# Patient Record
Sex: Male | Born: 1990 | Race: White | Hispanic: No | Marital: Single | Smoking: Current every day smoker
Health system: Southern US, Community
[De-identification: ages and names within clinical notes are randomized; demographics above are authoritative.]

## PROBLEM LIST (undated history)

## (undated) DIAGNOSIS — R6250 Unspecified lack of expected normal physiological development in childhood: Secondary | ICD-10-CM

## (undated) DIAGNOSIS — B019 Varicella without complication: Secondary | ICD-10-CM

## (undated) DIAGNOSIS — S060XAA Concussion with loss of consciousness status unknown, initial encounter: Secondary | ICD-10-CM

## (undated) DIAGNOSIS — J45909 Unspecified asthma, uncomplicated: Secondary | ICD-10-CM

## (undated) DIAGNOSIS — S060X9A Concussion with loss of consciousness of unspecified duration, initial encounter: Secondary | ICD-10-CM

## (undated) HISTORY — PX: TONSILLECTOMY AND ADENOIDECTOMY: SUR1326

## (undated) HISTORY — DX: Unspecified lack of expected normal physiological development in childhood: R62.50

## (undated) HISTORY — DX: Concussion with loss of consciousness status unknown, initial encounter: S06.0XAA

## (undated) HISTORY — PX: MYRINGOTOMY: SUR874

## (undated) HISTORY — DX: Concussion with loss of consciousness of unspecified duration, initial encounter: S06.0X9A

## (undated) HISTORY — DX: Varicella without complication: B01.9

## (undated) HISTORY — DX: Unspecified asthma, uncomplicated: J45.909

---
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
## 2011-01-12 NOTE — Assessment & Plan Note (Signed)
[facility]                           PRIMARY CARE OFFICE NOTE   NAME:Esquilin, Nakia                        MRN:          [id number]  DATE:02/06/2007                            DOB:          02/01/1991    Patient is a 20 year old male who presents for a wellness examination.  He is going to start playing football.   ALLERGIES:  None.   PAST MEDICAL HISTORY:  MRSA in the right ear two weeks ago, resolved  with antibiotic.  Chronically dry skin.  ADHD.   FAMILY HISTORY:  Brother with ADD.  Dry skin runs in the family.   SOCIAL HISTORY:  He is finishing 9th grade.   CURRENT MEDICATIONS:  Ritalin XR 1-2 tablets in the morning, 10 mg.   REVIEW OF SYSTEMS:  No chest pain, shortness of breath.  No  musculoskeletal complaints.  He uses glasses or contacts.  The rest of  the 18-point review of systems is negative.   PHYSICAL EXAMINATION:  VITAL SIGNS:  Blood pressure 113/65, pulse 56,  temp 98.1.  Weight 160 pounds.  GENERAL:  Looks well.  HEENT:  Moist mucosa.  NECK:  Supple.  No thyromegaly or bruit.  LUNGS:  Clear.  No wheezes or rales.  CARDIAC:  S1 and S2.  No murmur, no gallop.  ABDOMEN:  Soft, nontender.  No organomegaly.  No masses felt.  EXTREMITIES:  Lower extremities without edema.  SKIN:  Dry on the abdomen, flanks, ears, and legs.  GENITOURINARY:  Testicles, refused the examination.  Self-exam is  normal.  NEUROLOGIC:  He looked depressed.   LABS:  None.   ASSESSMENT/PLAN:  1. Normal wellness examination.  Age/health-related issues discussed.      Healthy lifestyle discussed with him and his mother.  Testicular      self-exam is reinforced.  He is okay to play football.  Advised to      avoid over-heating, drink Gatorade or Power Aid during practice or      play.  2. Attention-deficit disorder:  Ritalin LA 10 mg 1-2 in the morning.      He is to continue through the summer.  3. Methicillin-resistant staphylococcus aureus  infection, resolved.      Given information about      methicillin-resistant staphylococcus aureus to the patient and      mother.  4. Dry skin ,hereditory:  Triamcinolone and Eucerin 1:10 to use daily      p.r.n.     Georgina Quint. Plotnikov, MD  Electronically Signed    AVP/MedQ  DD: 02/06/2007  DT: 02/06/2007  Job #: [id number]
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
## 2011-10-15 ENCOUNTER — Ambulatory Visit: Payer: BC Managed Care – PPO | Admitting: Internal Medicine

## 2011-10-15 VITALS — BP 119/70 | HR 49 | Temp 98.3°F | Resp 18 | Ht 72.0 in | Wt 204.0 lb

## 2011-10-15 DIAGNOSIS — J040 Acute laryngitis: Secondary | ICD-10-CM

## 2011-10-15 DIAGNOSIS — J40 Bronchitis, not specified as acute or chronic: Secondary | ICD-10-CM

## 2011-10-15 MED ORDER — AZITHROMYCIN 250 MG PO TABS: ORAL_TABLET | ORAL | Status: DC

## 2011-10-15 MED ORDER — HYDROCODONE-ACETAMINOPHEN 7.5-500 MG/15ML PO SOLN: 5.0000 mL | Freq: Four times a day (QID) | ORAL | Status: DC | PRN

## 2011-10-15 NOTE — Progress Notes (Signed)
  Subjective:    Patient ID: Alfred Romero, male    DOB: 05-26-1991, 21 y.o.   MRN: [id number]  HPI Cough, fatigue, hoarseness. No SOB,CP   Review of Systems     Objective:   Physical Exam  Constitutional: He is oriented to person, place, and time. He appears well-developed and well-nourished. No distress.  HENT:  Right Ear: Tympanic membrane normal.  Left Ear: Tympanic membrane normal.  Nose: Mucosal edema, rhinorrhea and sinus tenderness present. Right sinus exhibits no maxillary sinus tenderness and no frontal sinus tenderness. Left sinus exhibits no maxillary sinus tenderness and no frontal sinus tenderness.  Mouth/Throat: Uvula is midline. No oropharyngeal exudate.  Eyes: EOM are normal.  Neck: Normal range of motion.  Cardiovascular: Normal rate and regular rhythm.   Pulmonary/Chest: Effort normal. No respiratory distress. He has no wheezes.  Neurological: He is alert and oriented to person, place, and time.          Assessment & Plan:  ZPAK Lortab 1tsp prn Resp. care

## 2011-10-20 ENCOUNTER — Encounter: Payer: Self-pay | Admitting: Family Medicine

## 2011-10-20 ENCOUNTER — Encounter: Payer: Self-pay | Admitting: *Deleted

## 2011-10-23 ENCOUNTER — Telehealth: Payer: Self-pay

## 2011-10-23 NOTE — Telephone Encounter (Signed)
.  [facility] Pt's mother called regarding receipt of a letter that stated [facility] had test results for her son Alfred Romero, but he has not been seen to have any testing done. Pt and Pt's mother would like clarification regarding this letter. Please contact them at [phone].

## 2011-10-24 NOTE — Telephone Encounter (Signed)
Spoke with pt's mother. The labs in ? were abstracted from pt's paper chart and I think that the results of the abstracted labs got sent to Dr. Elbert Ewings in which turn got sent to [place] to send a normal lab letter.

## 2012-12-17 ENCOUNTER — Encounter (HOSPITAL_COMMUNITY): Payer: Self-pay

## 2012-12-17 ENCOUNTER — Emergency Department (HOSPITAL_COMMUNITY)
Admission: EM | Admit: 2012-12-17 | Discharge: 2012-12-17 | Disposition: A | Discharge status: Home / Self Care | Payer: BC Managed Care – PPO | Source: Home / Self Care | Attending: Family Medicine | Admitting: Family Medicine

## 2012-12-17 DIAGNOSIS — J329 Chronic sinusitis, unspecified: Secondary | ICD-10-CM

## 2012-12-17 DIAGNOSIS — J40 Bronchitis, not specified as acute or chronic: Secondary | ICD-10-CM

## 2012-12-17 LAB — POCT RAPID STREP A: Streptococcus, Group A Screen (Direct): NEGATIVE

## 2012-12-17 MED ORDER — AZITHROMYCIN 250 MG PO TABS: ORAL_TABLET | ORAL | Status: DC

## 2012-12-17 MED ORDER — ALBUTEROL SULFATE HFA 108 (90 BASE) MCG/ACT IN AERS: 1.0000 | INHALATION_SPRAY | Freq: Four times a day (QID) | RESPIRATORY_TRACT | Status: DC | PRN

## 2012-12-17 MED ORDER — IBUPROFEN 600 MG PO TABS: 600.0000 mg | ORAL_TABLET | Freq: Three times a day (TID) | ORAL | Status: DC | PRN

## 2012-12-17 MED ORDER — PREDNISONE 20 MG PO TABS: ORAL_TABLET | ORAL | Status: DC

## 2012-12-17 MED ORDER — CETIRIZINE-PSEUDOEPHEDRINE ER 5-120 MG PO TB12: 1.0000 | ORAL_TABLET | Freq: Two times a day (BID) | ORAL | Status: DC | PRN

## 2012-12-17 NOTE — ED Notes (Signed)
Discussed percussive therapy for chest congestion

## 2012-12-17 NOTE — ED Provider Notes (Signed)
History     CSN: [id number]  Arrival date & time 12/17/12  1546   First MD Initiated Contact with Patient 12/17/12 1547      Chief Complaint  Patient presents with  . Cough    (Consider location/radiation/quality/duration/timing/severity/associated sxs/prior treatment) HPI Comments: 22 year old smoker male with history of asthma. Here complaining of sore throat, nasal congestion, nasal pressures sneezing headache fatigue and persistent coughing for the last 4 days. Denies fever but has had chills. Decreased appetite and decreased energy level. Denies chest pain, abdominal pain vomiting or diarrhea although reports intermittent nausea. 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   Past Medical History  Diagnosis Date  . 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 Hermelinda Medicus (endocrinologist)    Past Surgical History  Procedure Laterality Date  . Tonsillectomy and adenoidectomy    . Myringotomy      History reviewed. No pertinent family history.  History  Substance Use Topics  . Smoking status: Current Every Day Smoker  . Smokeless tobacco: Not on file  . Alcohol Use: Not on file      Review of Systems  Constitutional: Positive for chills, appetite change and fatigue. Negative for fever and diaphoresis.  HENT: Positive for congestion, sore throat, rhinorrhea and sneezing. Negative for trouble swallowing.   Eyes: Negative for discharge.  Respiratory: Positive for cough. Negative for shortness of breath.   Cardiovascular: Negative for chest pain.  Gastrointestinal: Negative for nausea, vomiting, abdominal pain and diarrhea.  Skin: Negative for rash.  Neurological: Positive for headaches. Negative for dizziness.  All other systems reviewed and are negative.    Allergies  Review of patient's allergies indicates no known allergies.  Home Medications   Current Outpatient Rx  Name  Route  Sig  Dispense  Refill  . albuterol (PROVENTIL HFA;VENTOLIN  HFA) 108 (90 BASE) MCG/ACT inhaler   Inhalation   Inhale 1-2 puffs into the lungs every 6 (six) hours as needed for wheezing.   1 Inhaler   0   . azithromycin (ZITHROMAX) 250 MG tablet      2 tabs by mouth daily 1 then 1 tablet daily for 4 more days.   6 tablet   0   . cetirizine-pseudoephedrine (ZYRTEC-D) 5-120 MG per tablet   Oral   Take 1 tablet by mouth 2 (two) times daily as needed for allergies or rhinitis.   30 tablet   0   . ibuprofen (ADVIL,MOTRIN) 600 MG tablet   Oral   Take 1 tablet (600 mg total) by mouth every 8 (eight) hours as needed for pain or fever.   30 tablet   0   . predniSONE (DELTASONE) 20 MG tablet      2 tabs by mouth daily for 5 days   10 tablet   0     BP 132/77  Pulse 68  Temp(Src) 98.7 F (37.1 C) (Oral)  Resp 18  SpO2 98%  Physical Exam  Nursing note and vitals reviewed. Constitutional: He is oriented to person, place, and time. He appears well-developed and well-nourished. No distress.  HENT:  Head: Normocephalic and atraumatic.  Nasal Congestion with erythema and swelling of nasal turbinates, rhinorrhea. pharyngeal erythema no exudates. No uvula deviation. No trismus. TM's with clear fluid and increased vascular markings no dullness bilaterally no swelling or bulging  Cardiovascular: Normal rate, regular rhythm and normal heart sounds.   Pulmonary/Chest: Effort normal. No respiratory distress. He has no wheezes. He has no rales.  He exhibits no tenderness.  Mildly prolonged expiration and bilateral scattered expiratory rhonchi. No active wheezing. No orthopnea or tachypnea. No rales  Neurological: He is alert and oriented to person, place, and time.  Skin: No rash noted. He is not diaphoretic.    ED Course  Procedures (including critical care time)  Labs Reviewed  POCT RAPID STREP A ([facility] CARE ONLY)   No results found.   1. Bronchitis   2. Rhinosinusitis       MDM  Encouraged to quit smoking. Prescribed albuterol,  prednisone, cetirizine/pseudoephedrine, ibuprofen and provided a hold prescription for azithromycin. Supportive care and red flags should prompt his return to medical attention discussed with patient and provided in writing.      Sharin Grave, MD 12/17/12 1706

## 2012-12-17 NOTE — ED Notes (Signed)
C/o cough, congestion, ST; NAD, handling secretions well; minimal relief w OTC medications

## 2015-09-23 ENCOUNTER — Encounter (HOSPITAL_COMMUNITY): Payer: Self-pay | Admitting: Emergency Medicine

## 2015-09-23 ENCOUNTER — Emergency Department (HOSPITAL_COMMUNITY)
Admission: EM | Admit: 2015-09-23 | Discharge: 2015-09-23 | Disposition: A | Discharge status: Home / Self Care | Payer: Self-pay | Attending: Emergency Medicine | Admitting: Emergency Medicine

## 2015-09-23 DIAGNOSIS — Y990 Civilian activity done for income or pay: Secondary | ICD-10-CM | POA: Insufficient documentation

## 2015-09-23 DIAGNOSIS — Z23 Encounter for immunization: Secondary | ICD-10-CM | POA: Insufficient documentation

## 2015-09-23 DIAGNOSIS — Y92812 Truck as the place of occurrence of the external cause: Secondary | ICD-10-CM | POA: Insufficient documentation

## 2015-09-23 DIAGNOSIS — Z79899 Other long term (current) drug therapy: Secondary | ICD-10-CM | POA: Insufficient documentation

## 2015-09-23 DIAGNOSIS — F17200 Nicotine dependence, unspecified, uncomplicated: Secondary | ICD-10-CM | POA: Insufficient documentation

## 2015-09-23 DIAGNOSIS — Y9389 Activity, other specified: Secondary | ICD-10-CM | POA: Insufficient documentation

## 2015-09-23 DIAGNOSIS — W208XXA Other cause of strike by thrown, projected or falling object, initial encounter: Secondary | ICD-10-CM | POA: Insufficient documentation

## 2015-09-23 DIAGNOSIS — S0501XA Injury of conjunctiva and corneal abrasion without foreign body, right eye, initial encounter: Secondary | ICD-10-CM

## 2015-09-23 MED ORDER — TETANUS-DIPHTH-ACELL PERTUSSIS 5-2.5-18.5 LF-MCG/0.5 IM SUSP
0.5000 mL | Freq: Once | INTRAMUSCULAR | Status: AC
  Administered 2015-09-23: 0.5 mL via INTRAMUSCULAR
  Filled 2015-09-23: qty 0.5

## 2015-09-23 MED ORDER — TETRACAINE HCL 0.5 % OP SOLN
2.0000 [drp] | Freq: Once | OPHTHALMIC | Status: AC
  Administered 2015-09-23: 2 [drp] via OPHTHALMIC
  Filled 2015-09-23: qty 2

## 2015-09-23 MED ORDER — LEVOFLOXACIN 0.5 % OP SOLN: OPHTHALMIC | Status: DC

## 2015-09-23 MED ORDER — FLUORESCEIN SODIUM 1 MG OP STRP
1.0000 | ORAL_STRIP | Freq: Once | OPHTHALMIC | Status: AC
  Administered 2015-09-23: 1 via OPHTHALMIC
  Filled 2015-09-23: qty 1

## 2015-09-23 NOTE — ED Notes (Signed)
PA at bedside flushing eye.

## 2015-09-23 NOTE — ED Provider Notes (Signed)
CSN: [id number]     Arrival date & time 09/23/15  1930 History  By signing my name below, I, Karl Ito, attest that this documentation has been prepared under the direction and in the presence of non-physician practitioner, Elizabeth Sauer, PA-C. Electronically Signed: Linna Darner, Scribe. 09/23/2015. 8:03 PM.    Chief Complaint  Patient presents with  . Eye Injury     The history is provided by the patient. No language interpreter was used.     HPI Comments: Alfred Romero is a 25 y.o. male who presents to the Emergency Department complaining of sudden onset, intermittent, worsening, 4/10, scratching right eye pain beginning at approximately 9 o'clock this morning . He reports that he was working on his truck when a piece of rust hit him in the eye.He reports that he flushed his eye out with contact drops. He endorses that he is able to see out of his right eye when the pain is not present. He reports that he wears contacts and was wearing them when the accident occurred. He states that he took them out s/p being struck by the rust. Pt reports that he is tender to palpation towards the center of his eye. He denies burning, eye numbness, loss of vision, blurry vision, or any other associated symptoms at this time. Tetanus status unknown.   Past Medical History  Diagnosis Date  . 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 Hermelinda Medicus (endocrinologist)   Past Surgical History  Procedure Laterality Date  . Tonsillectomy and adenoidectomy    . Myringotomy     No family history on file. Social History  Substance Use Topics  . Smoking status: Current Every Day Smoker  . Smokeless tobacco: None  . Alcohol Use: None    Review of Systems  Constitutional: Negative for fever.  Eyes: Positive for pain. Negative for visual disturbance.       Blurry vision in right eye when pain presents  Neurological: Negative for headaches.      Allergies  Review of patient's  allergies indicates no known allergies.  Home Medications   Prior to Admission medications   Medication Sig Start Date End Date Taking? Authorizing Provider  albuterol (PROVENTIL HFA;VENTOLIN HFA) 108 (90 BASE) MCG/ACT inhaler Inhale 1-2 puffs into the lungs every 6 (six) hours as needed for wheezing. 12/17/12   Adlih Moreno-Coll, MD  azithromycin (ZITHROMAX) 250 MG tablet 2 tabs by mouth daily 1 then 1 tablet daily for 4 more days. 12/17/12   Adlih Moreno-Coll, MD  cetirizine-pseudoephedrine (ZYRTEC-D) 5-120 MG per tablet Take 1 tablet by mouth 2 (two) times daily as needed for allergies or rhinitis. 12/17/12   Adlih Moreno-Coll, MD  ibuprofen (ADVIL,MOTRIN) 600 MG tablet Take 1 tablet (600 mg total) by mouth every 8 (eight) hours as needed for pain or fever. 12/17/12   Adlih Moreno-Coll, MD  levofloxacin Charlean Sanfilippo) 0.5 % ophthalmic solution 1-2 drops in eye every two hours while awake for the first two days, THEN every 4-8 hours for the next 5 days 09/23/15   [facility] Mahmoud Blazejewski, PA-C  predniSONE (DELTASONE) 20 MG tablet 2 tabs by mouth daily for 5 days 12/17/12   Adlih Moreno-Coll, MD   BP 120/63 mmHg  Pulse 67  Temp(Src) 98.4 F (36.9 C) (Oral)  Resp 16  SpO2 98% Physical Exam  Constitutional: He is oriented to person, place, and time. He appears well-developed and well-nourished. No distress.  HENT:  Head: Normocephalic and atraumatic.  Eyes:  Conjunctivae and EOM are normal. Pupils are equal, round, and reactive to light. Lids are everted and swept, no foreign bodies found. Right eye exhibits no discharge. No foreign body present in the right eye.  Slit lamp exam:      The right eye shows corneal abrasion and fluorescein uptake. The right eye shows no foreign body.    No tenderness to palpation of lower lid margin Mild TTP to upper mid margin Fluorescein uptake / abrasion as depicted in image. No rust ring seen.    Neck: Neck supple. No tracheal deviation present.  Cardiovascular: Normal  rate.   Pulmonary/Chest: Effort normal. No respiratory distress.  Musculoskeletal: Normal range of motion.  Neurological: He is alert and oriented to person, place, and time.  Skin: Skin is warm and dry.  Psychiatric: He has a normal mood and affect. His behavior is normal.  Nursing note and vitals reviewed.   ED Course  Procedures (including critical care time)  DIAGNOSTIC STUDIES: Oxygen Saturation is 100% on RA, normal by my interpretation.    COORDINATION OF CARE:  7:32 PM Discussed treatment plan which includes stain with fluorescein dye and tetracaine  with pt at bedside and pt agreed to plan.   Labs Review Labs Reviewed - No data to display  Imaging Review No results found.    EKG Interpretation None      MDM   Final diagnoses:  Corneal abrasion, right, initial encounter   Maura Crandall presents after rust went into his eye this morning. Eye flushed, lid everted, eye explored - no FB seen. No rust ring appreciated. Abrasion noted on staining. + contact wearer  Plan: FQ eye drops due to contact lens use Stressed the importance of following up with the eye doctor within the next 24-48 hours No contact in eye until seen by eye doc Return precautions and home care instructions given.   I personally performed the services described in this documentation, which was scribed in my presence. The recorded information has been reviewed and is accurate.  [facility] Zeriah Baysinger, PA-C 09/23/15 1610  Arby Barrette, MD 09/24/15 [phone]

## 2015-09-23 NOTE — Discharge Instructions (Signed)
YOU NEED TO SEE THE EYE DOCTOR, PREFERABLY WITHIN THE NEXT 48 HOURS Do not wear contact lens in your right eye Use eye drops as directed Return to ER for any new or worsening symptoms, any additional concerns.

## 2015-09-23 NOTE — ED Notes (Signed)
Pt reports he got rust in eye this morning and has been having pain since then with difficulty opening eye. No other vision changes from baseline.

## 2016-06-26 ENCOUNTER — Encounter (HOSPITAL_COMMUNITY): Payer: Self-pay | Admitting: Emergency Medicine

## 2016-06-26 ENCOUNTER — Emergency Department (HOSPITAL_COMMUNITY): Payer: 59

## 2016-06-26 ENCOUNTER — Emergency Department (HOSPITAL_COMMUNITY)
Admission: EM | Admit: 2016-06-26 | Discharge: 2016-06-26 | Disposition: A | Discharge status: Home / Self Care | Payer: 59 | Attending: Emergency Medicine | Admitting: Emergency Medicine

## 2016-06-26 DIAGNOSIS — S20219A Contusion of unspecified front wall of thorax, initial encounter: Secondary | ICD-10-CM | POA: Insufficient documentation

## 2016-06-26 DIAGNOSIS — S199XXA Unspecified injury of neck, initial encounter: Secondary | ICD-10-CM | POA: Diagnosis present

## 2016-06-26 DIAGNOSIS — F17200 Nicotine dependence, unspecified, uncomplicated: Secondary | ICD-10-CM | POA: Diagnosis not present

## 2016-06-26 DIAGNOSIS — Y939 Activity, unspecified: Secondary | ICD-10-CM | POA: Insufficient documentation

## 2016-06-26 DIAGNOSIS — T07XXXA Unspecified multiple injuries, initial encounter: Secondary | ICD-10-CM

## 2016-06-26 DIAGNOSIS — S60511A Abrasion of right hand, initial encounter: Secondary | ICD-10-CM | POA: Insufficient documentation

## 2016-06-26 DIAGNOSIS — S0990XA Unspecified injury of head, initial encounter: Secondary | ICD-10-CM | POA: Diagnosis not present

## 2016-06-26 DIAGNOSIS — S0081XA Abrasion of other part of head, initial encounter: Secondary | ICD-10-CM | POA: Diagnosis not present

## 2016-06-26 DIAGNOSIS — Y999 Unspecified external cause status: Secondary | ICD-10-CM | POA: Diagnosis not present

## 2016-06-26 DIAGNOSIS — S161XXA Strain of muscle, fascia and tendon at neck level, initial encounter: Secondary | ICD-10-CM | POA: Insufficient documentation

## 2016-06-26 DIAGNOSIS — J45909 Unspecified asthma, uncomplicated: Secondary | ICD-10-CM | POA: Diagnosis not present

## 2016-06-26 DIAGNOSIS — F909 Attention-deficit hyperactivity disorder, unspecified type: Secondary | ICD-10-CM | POA: Diagnosis not present

## 2016-06-26 DIAGNOSIS — Y92410 Unspecified street and highway as the place of occurrence of the external cause: Secondary | ICD-10-CM | POA: Insufficient documentation

## 2016-06-26 MED ORDER — HYDROCODONE-ACETAMINOPHEN 5-325 MG PO TABS: 2.0000 | ORAL_TABLET | Freq: Four times a day (QID) | ORAL | 0 refills | Status: DC | PRN
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
## 2016-06-26 NOTE — Discharge Instructions (Signed)
Take ibuprofen 600 mg every 6 hours as needed for pain.  Hydrocodone as prescribed as needed for pain not relieved with ibuprofen.  Return to the emergency department if you develop abdominal pain, difficulty breathing, dizziness, chest pain, or other new and concerning symptoms.

## 2016-06-26 NOTE — ED Notes (Signed)
Pt ambulated with no assistance.

## 2016-06-26 NOTE — ED Notes (Signed)
Patient transported to X-ray 

## 2016-06-26 NOTE — ED Notes (Signed)
Placed patient on the monitor did vital signs waiting on provider
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
## 2016-06-28 ENCOUNTER — Emergency Department (HOSPITAL_COMMUNITY): Payer: 59

## 2016-06-28 ENCOUNTER — Encounter (HOSPITAL_COMMUNITY): Payer: Self-pay | Admitting: Emergency Medicine

## 2016-06-28 ENCOUNTER — Emergency Department (HOSPITAL_COMMUNITY)
Admission: EM | Admit: 2016-06-28 | Discharge: 2016-06-28 | Disposition: A | Discharge status: Home / Self Care | Payer: 59 | Attending: Emergency Medicine | Admitting: Emergency Medicine

## 2016-06-28 DIAGNOSIS — F17200 Nicotine dependence, unspecified, uncomplicated: Secondary | ICD-10-CM | POA: Insufficient documentation

## 2016-06-28 DIAGNOSIS — M791 Myalgia: Secondary | ICD-10-CM | POA: Diagnosis not present

## 2016-06-28 DIAGNOSIS — R51 Headache: Secondary | ICD-10-CM | POA: Diagnosis present

## 2016-06-28 DIAGNOSIS — M7918 Myalgia, other site: Secondary | ICD-10-CM

## 2016-06-28 DIAGNOSIS — R519 Headache, unspecified: Secondary | ICD-10-CM

## 2016-06-28 MED ORDER — METHOCARBAMOL 500 MG PO TABS
1000.0000 mg | ORAL_TABLET | Freq: Once | ORAL | Status: AC
  Administered 2016-06-28: 1000 mg via ORAL
  Filled 2016-06-28: qty 2

## 2016-06-28 MED ORDER — METHOCARBAMOL 500 MG PO TABS: 500.0000 mg | ORAL_TABLET | Freq: Two times a day (BID) | ORAL | 0 refills | Status: DC
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
## 2016-10-25 ENCOUNTER — Encounter: Payer: Self-pay | Admitting: Internal Medicine

## 2016-10-25 ENCOUNTER — Ambulatory Visit (INDEPENDENT_AMBULATORY_CARE_PROVIDER_SITE_OTHER): Payer: 59 | Admitting: Internal Medicine

## 2016-10-25 ENCOUNTER — Other Ambulatory Visit (INDEPENDENT_AMBULATORY_CARE_PROVIDER_SITE_OTHER): Payer: 59

## 2016-10-25 DIAGNOSIS — G40909 Epilepsy, unspecified, not intractable, without status epilepticus: Secondary | ICD-10-CM

## 2016-10-25 DIAGNOSIS — F8081 Childhood onset fluency disorder: Secondary | ICD-10-CM

## 2016-10-25 LAB — CBC WITH DIFFERENTIAL/PLATELET
BASOS PCT: 0.5 % (ref 0.0–3.0)
Basophils Absolute: 0 10*3/uL (ref 0.0–0.1)
Eosinophils Absolute: 0.1 10*3/uL (ref 0.0–0.7)
Eosinophils Relative: 1.8 % (ref 0.0–5.0)
HEMATOCRIT: 44 % (ref 39.0–52.0)
Hemoglobin: 15.1 g/dL (ref 13.0–17.0)
LYMPHS ABS: 2.3 10*3/uL (ref 0.7–4.0)
Lymphocytes Relative: 40.9 % (ref 12.0–46.0)
MCHC: 34.4 g/dL (ref 30.0–36.0)
MCV: 89.3 fl (ref 78.0–100.0)
MONOS PCT: 10.7 % (ref 3.0–12.0)
Monocytes Absolute: 0.6 10*3/uL (ref 0.1–1.0)
NEUTROS ABS: 2.6 10*3/uL (ref 1.4–7.7)
NEUTROS PCT: 46.1 % (ref 43.0–77.0)
Platelets: 179 10*3/uL (ref 150.0–400.0)
RBC: 4.93 Mil/uL (ref 4.22–5.81)
RDW: 13.2 % (ref 11.5–15.5)
WBC: 5.6 10*3/uL (ref 4.0–10.5)

## 2016-10-25 LAB — BASIC METABOLIC PANEL
BUN: 14 mg/dL (ref 6–23)
CALCIUM: 8.9 mg/dL (ref 8.4–10.5)
CHLORIDE: 108 meq/L (ref 96–112)
CO2: 25 meq/L (ref 19–32)
Creatinine, Ser: 0.72 mg/dL (ref 0.40–1.50)
GFR: 141.76 mL/min (ref >60.00)
GLUCOSE: 94 mg/dL (ref 70–99)
Potassium: 4.3 mEq/L (ref 3.5–5.1)
SODIUM: 139 meq/L (ref 135–145)

## 2016-10-25 LAB — HEPATIC FUNCTION PANEL
ALBUMIN: 4.1 g/dL (ref 3.5–5.2)
ALT: 41 U/L (ref 0–53)
AST: 21 U/L (ref 0–37)
Alkaline Phosphatase: 86 U/L (ref 39–117)
Bilirubin, Direct: 0.1 mg/dL (ref 0.0–0.3)
TOTAL PROTEIN: 6.5 g/dL (ref 6.0–8.3)
Total Bilirubin: 0.6 mg/dL (ref 0.2–1.2)

## 2016-10-25 LAB — URINALYSIS
BILIRUBIN URINE: NEGATIVE
Hgb urine dipstick: NEGATIVE
KETONES UR: NEGATIVE
LEUKOCYTES UA: NEGATIVE
Nitrite: NEGATIVE
Specific Gravity, Urine: 1.01 (ref 1.000–1.030)
Total Protein, Urine: NEGATIVE
URINE GLUCOSE: NEGATIVE
UROBILINOGEN UA: 1 (ref 0.0–1.0)
pH: 6 (ref 5.0–8.0)

## 2016-10-25 LAB — VITAMIN B12: VITAMIN B 12: 383 pg/mL (ref 211–911)

## 2016-10-25 LAB — TSH: TSH: 0.85 u[IU]/mL (ref 0.35–4.50)

## 2016-10-25 MED ORDER — VITAMIN D3 50 MCG (2000 UT) PO CAPS: 2000.0000 [IU] | ORAL_CAPSULE | Freq: Every day | ORAL | 3 refills | Status: DC

## 2016-10-25 MED ORDER — B COMPLEX PO TABS: 1.0000 | ORAL_TABLET | Freq: Every day | ORAL | 3 refills | Status: DC
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
## 2016-10-25 NOTE — Patient Instructions (Signed)
No driving °

## 2016-10-25 NOTE — Progress Notes (Signed)
Pre-visit discussion using our clinic review tool. No additional management support is needed unless otherwise documented below in the visit note.  
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
## 2016-12-27 ENCOUNTER — Encounter: Payer: Self-pay | Admitting: Internal Medicine

## 2016-12-27 ENCOUNTER — Ambulatory Visit: Payer: 59 | Admitting: Internal Medicine

## 2016-12-27 ENCOUNTER — Ambulatory Visit (INDEPENDENT_AMBULATORY_CARE_PROVIDER_SITE_OTHER): Payer: 59 | Admitting: Internal Medicine

## 2016-12-27 DIAGNOSIS — G40909 Epilepsy, unspecified, not intractable, without status epilepticus: Secondary | ICD-10-CM

## 2016-12-27 MED ORDER — ONE-A-DAY MENS PO TABS
1.0000 | ORAL_TABLET | Freq: Every day | ORAL | 3 refills | Status: AC
Start: 2016-12-27 — End: ?

## 2016-12-27 NOTE — Assessment & Plan Note (Addendum)
No relapse Neurol appt - 5/7 pending MVI

## 2016-12-27 NOTE — Progress Notes (Signed)
Pre visit review using our clinic review tool, if applicable. No additional management support is needed unless otherwise documented below in the visit note. 
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
## 2017-01-03 ENCOUNTER — Ambulatory Visit: Payer: 59 | Admitting: Neurology

## 2017-02-28 ENCOUNTER — Ambulatory Visit (INDEPENDENT_AMBULATORY_CARE_PROVIDER_SITE_OTHER): Payer: 59 | Admitting: Neurology

## 2017-02-28 ENCOUNTER — Encounter: Payer: Self-pay | Admitting: Neurology

## 2017-02-28 VITALS — BP 118/80 | HR 54 | Ht 72.0 in | Wt 201.0 lb

## 2017-02-28 DIAGNOSIS — F8081 Childhood onset fluency disorder: Secondary | ICD-10-CM

## 2017-02-28 DIAGNOSIS — R569 Unspecified convulsions: Secondary | ICD-10-CM

## 2017-02-28 NOTE — Patient Instructions (Signed)
1. Schedule MRI brain with and without contrast 2. Schedule 1-hour sleep-deprived EEG 3. If you have any episode of loss of consciousness, you should stop driving until 6 months event-free 4. Follow-up in 5-6 months,call for any changes
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
## 2017-03-08 ENCOUNTER — Other Ambulatory Visit: Payer: Self-pay | Admitting: Neurology

## 2017-03-08 DIAGNOSIS — T1590XA Foreign body on external eye, part unspecified, unspecified eye, initial encounter: Secondary | ICD-10-CM

## 2017-03-14 ENCOUNTER — Ambulatory Visit (INDEPENDENT_AMBULATORY_CARE_PROVIDER_SITE_OTHER): Payer: 59 | Admitting: Neurology

## 2017-03-14 DIAGNOSIS — R569 Unspecified convulsions: Secondary | ICD-10-CM

## 2017-03-14 DIAGNOSIS — F8081 Childhood onset fluency disorder: Secondary | ICD-10-CM

## 2017-03-15 NOTE — Procedures (Signed)
ELECTROENCEPHALOGRAM REPORT  Date of Study: 03/14/2017  Patient's Name: Maura CrandallDavid A Zambrana MRN: [id number] Date of Birth: 1990/12/17  Referring Provider: Dr. Patrcia DollyKaren Aquino  Clinical History: This is a 26 year old man with stuttering and report of episodes of eye rolling and convulsing.   Medications: multivitamins  Technical Summary: A multichannel digital 1-hour sleep-deprived EEG recording measured by the international 10-20 system with electrodes applied with paste and impedances below 5000 ohms performed in our laboratory with EKG monitoring in an awake and asleep patient.  Hyperventilation and photic stimulation were performed.  The digital EEG was referentially recorded, reformatted, and digitally filtered in a variety of bipolar and referential montages for optimal display.    Description: The patient is awake and asleep during the recording.  During maximal wakefulness, there is a symmetric, medium voltage 8.5-9 Hz posterior dominant rhythm that attenuates with eye opening.  The record is symmetric.  During drowsiness and sleep, there is an increase in theta slowing of the background.  Vertex waves and symmetric sleep spindles were seen.  Hyperventilation and photic stimulation did not elicit any abnormalities.  There were no epileptiform discharges or electrographic seizures seen.    EKG lead was unremarkable.  Impression: This 1-hour awake and asleep EEG is normal.    Clinical Correlation: A normal EEG does not exclude a clinical diagnosis of epilepsy.  If further clinical questions remain, prolonged EEG may be helpful.  Clinical correlation is advised.   Patrcia DollyKaren Aquino, M.D.

## 2017-03-16 ENCOUNTER — Telehealth: Payer: Self-pay

## 2017-03-16 NOTE — Telephone Encounter (Signed)
-----   Message from Van ClinesKaren M Aquino, MD sent at 03/15/2017  2:34 PM EDT ----- Pls let him know the brain wave test was normal, thanks

## 2017-03-16 NOTE — Telephone Encounter (Signed)
Spoke with pt, relaying message below. 

## 2017-03-18 ENCOUNTER — Other Ambulatory Visit: Payer: Self-pay | Admitting: Neurology

## 2017-03-21 ENCOUNTER — Ambulatory Visit
Admission: RE | Admit: 2017-03-21 | Discharge: 2017-03-21 | Disposition: A | Discharge status: Home / Self Care | Payer: 59 | Source: Ambulatory Visit | Attending: Neurology | Admitting: Neurology

## 2017-03-21 DIAGNOSIS — R569 Unspecified convulsions: Secondary | ICD-10-CM

## 2017-03-21 DIAGNOSIS — T1590XA Foreign body on external eye, part unspecified, unspecified eye, initial encounter: Secondary | ICD-10-CM

## 2017-03-21 DIAGNOSIS — F8081 Childhood onset fluency disorder: Secondary | ICD-10-CM

## 2017-03-21 MED ORDER — GADOBENATE DIMEGLUMINE 529 MG/ML IV SOLN
20.0000 mL | Freq: Once | INTRAVENOUS | Status: AC | PRN
  Administered 2017-03-21: 20 mL via INTRAVENOUS

## 2017-03-22 ENCOUNTER — Telehealth: Payer: Self-pay

## 2017-03-22 ENCOUNTER — Encounter: Payer: Self-pay | Admitting: Neurology

## 2017-03-22 NOTE — Telephone Encounter (Signed)
Spoke with pt's mother.  Asked that she have pt return call so I can relay message below.

## 2017-03-22 NOTE — Telephone Encounter (Signed)
-----   Message from Van ClinesKaren M Aquino, MD sent at 03/22/2017 12:02 PM EDT ----- Please let patient know I reviewed MRI brain and it is normal, no evidence of tumor, stroke, or bleed.  thanks

## 2017-03-22 NOTE — Telephone Encounter (Signed)
-----   Message from Karen M Aquino, MD sent at 03/22/2017 12:02 PM EDT ----- Please let patient know I reviewed MRI brain and it is normal, no evidence of tumor, stroke, or bleed.  thanks  

## 2017-03-22 NOTE — Telephone Encounter (Signed)
Called pt.  No answer.  Voicemail box is not set up.  Unable to relay message below.

## 2017-03-22 NOTE — Telephone Encounter (Signed)
Spoke with patient and made him aware of results 

## 2017-03-22 NOTE — Telephone Encounter (Signed)
Noted! Thank you

## 2017-03-22 NOTE — Telephone Encounter (Signed)
This encounter was created in error - please disregard.

## 2017-07-04 ENCOUNTER — Encounter: Payer: Self-pay | Admitting: Internal Medicine

## 2017-07-04 ENCOUNTER — Ambulatory Visit: Payer: 59 | Admitting: Internal Medicine

## 2017-07-04 DIAGNOSIS — G40909 Epilepsy, unspecified, not intractable, without status epilepticus: Secondary | ICD-10-CM | POA: Diagnosis not present

## 2017-07-04 DIAGNOSIS — F0781 Postconcussional syndrome: Secondary | ICD-10-CM | POA: Diagnosis not present

## 2017-07-04 NOTE — Assessment & Plan Note (Signed)
Resolved
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
## 2017-07-04 NOTE — Assessment & Plan Note (Signed)
The pt saw Dr Karel JarvisAquino 2018 Clinical Correlation: A normal EEG does not exclude a clinical diagnosis of epilepsy.  If further clinical questions remain, prolonged EEG may be helpful.  Clinical correlation is advised. OK to drive

## 2017-07-04 NOTE — Patient Instructions (Addendum)
Use [employer] Peroxicare tooth paste Low carb (sugars, starches) diet

## 2017-08-18 ENCOUNTER — Other Ambulatory Visit: Payer: Self-pay

## 2017-08-18 ENCOUNTER — Ambulatory Visit (HOSPITAL_COMMUNITY)
Admission: EM | Admit: 2017-08-18 | Discharge: 2017-08-18 | Disposition: A | Discharge status: Home / Self Care | Payer: 59 | Attending: Family Medicine | Admitting: Family Medicine

## 2017-08-18 ENCOUNTER — Encounter (HOSPITAL_COMMUNITY): Payer: Self-pay | Admitting: Emergency Medicine

## 2017-08-18 ENCOUNTER — Ambulatory Visit: Payer: Self-pay | Admitting: *Deleted

## 2017-08-18 DIAGNOSIS — R1013 Epigastric pain: Secondary | ICD-10-CM

## 2017-08-18 MED ORDER — GI COCKTAIL ~~LOC~~
30.0000 mL | Freq: Once | ORAL | Status: AC
Start: 2017-08-18 — End: 2017-08-18
  Administered 2017-08-18: 30 mL via ORAL

## 2017-08-18 MED ORDER — GI COCKTAIL ~~LOC~~
ORAL | Status: AC
  Filled 2017-08-18: qty 30

## 2017-08-18 MED ORDER — OMEPRAZOLE 20 MG PO CPDR: 20.0000 mg | DELAYED_RELEASE_CAPSULE | Freq: Every day | ORAL | 0 refills | Status: AC

## 2017-08-18 MED ORDER — NAPROXEN 375 MG PO TABS: 375.0000 mg | ORAL_TABLET | Freq: Two times a day (BID) | ORAL | 0 refills | Status: AC
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
## 2017-08-18 NOTE — Telephone Encounter (Signed)
He also stated he took 2 Advil without relief.

## 2017-08-18 NOTE — ED Triage Notes (Signed)
Pt was at work and started having a really bad pain in his upper abdomen. Constant throbbing, hurts worse with movement, deep breath.

## 2017-08-18 NOTE — Discharge Instructions (Signed)
Please see attached food choices for GERD. Omeprazole daily. May try naproxen if continues to feel muscular in nature, do not take on an empty stomach as this may exacerbate your symptoms. If develop increased pain, fevers, nausea, vomiting, shortness of breath, chest pain return to be seen or visit Ed. Please follow up with your PCP in the next 1-2 weeks if symptoms persist.

## 2017-08-18 NOTE — Telephone Encounter (Signed)
Pt states the pain started this morning, mid chest and spreads out from left to right. Worst when taking a deep breathe and movement.  Can not check b/p, at work.  His mom will take him to Urgent care.  Reason for Disposition . Taking a deep breath makes pain worse  Answer Assessment - Initial Assessment Questions 1. LOCATION: "Where does it hurt?"       Under the sternum right side 2. RADIATION: "Does the pain go anywhere else?" (e.g., into neck, jaw, arms, back)     More dense in the center, spreads out to the left and right side 3. ONSET: "When did the chest pain begin?" (Minutes, hours or days)      This morning around 7 jor 730 4. PATTERN "Does the pain come and go, or has it been constant since it started?"  "Does it get worse with exertion?"      Constant, moving and breathing makes it worst 5. DURATION: "How long does it last" (e.g., seconds, minutes, hours)     constant 6. SEVERITY: "How bad is the pain?"  (e.g., Scale 1-10; mild, moderate, or severe)    - MILD (1-3): doesn't interfere with normal activities     - MODERATE (4-7): interferes with normal activities or awakens from sleep    - SEVERE (8-10): excruciating pain, unable to do any normal activities       6 7. CARDIAC RISK FACTORS: "Do you have any history of heart problems or risk factors for heart disease?" (e.g., prior heart attack, angina; high blood pressure, diabetes, being overweight, high cholesterol, smoking, or strong family history of heart disease)     Smoking, is not sure if family has hx of heart disease 8. PULMONARY RISK FACTORS: "Do you have any history of lung disease?"  (e.g., blood clots in lung, asthma, emphysema, birth control pills)     Hx of asthma in younger years 9. CAUSE: "What do you think is causing the chest pain?"     Not sure 10. OTHER SYMPTOMS: "Do you have any other symptoms?" (e.g., dizziness, nausea, vomiting, sweating, fever, difficulty breathing, cough)       A little sweating because  he thinks he is at work, hurts to take a deep breath 11. PREGNANCY: "Is there any chance you are pregnant?" "When was your last menstrual period?"       n/a  Protocols used: CHEST PAIN-A-AH

## 2017-09-02 ENCOUNTER — Ambulatory Visit: Payer: 59 | Admitting: Neurology
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
## 2019-08-08 ENCOUNTER — Other Ambulatory Visit: Payer: Self-pay

## 2019-08-08 DIAGNOSIS — Z20822 Contact with and (suspected) exposure to covid-19: Secondary | ICD-10-CM

## 2019-08-09 LAB — NOVEL CORONAVIRUS, NAA: SARS-CoV-2, NAA: NOT DETECTED

## 2020-11-20 ENCOUNTER — Other Ambulatory Visit: Payer: Self-pay

## 2020-11-20 ENCOUNTER — Encounter (HOSPITAL_BASED_OUTPATIENT_CLINIC_OR_DEPARTMENT_OTHER): Payer: Self-pay | Admitting: Emergency Medicine

## 2020-11-20 ENCOUNTER — Emergency Department (HOSPITAL_BASED_OUTPATIENT_CLINIC_OR_DEPARTMENT_OTHER)
Admission: EM | Admit: 2020-11-20 | Discharge: 2020-11-20 | Disposition: A | Discharge status: Home / Self Care | Payer: 59 | Attending: Emergency Medicine | Admitting: Emergency Medicine

## 2020-11-20 DIAGNOSIS — H5711 Ocular pain, right eye: Secondary | ICD-10-CM | POA: Diagnosis present

## 2020-11-20 DIAGNOSIS — F17210 Nicotine dependence, cigarettes, uncomplicated: Secondary | ICD-10-CM | POA: Insufficient documentation

## 2020-11-20 DIAGNOSIS — Y990 Civilian activity done for income or pay: Secondary | ICD-10-CM | POA: Insufficient documentation

## 2020-11-20 DIAGNOSIS — T1501XA Foreign body in cornea, right eye, initial encounter: Secondary | ICD-10-CM | POA: Insufficient documentation

## 2020-11-20 DIAGNOSIS — W3189XA Contact with other specified machinery, initial encounter: Secondary | ICD-10-CM | POA: Diagnosis not present

## 2020-11-20 DIAGNOSIS — J45909 Unspecified asthma, uncomplicated: Secondary | ICD-10-CM | POA: Diagnosis not present

## 2020-11-20 MED ORDER — ERYTHROMYCIN 5 MG/GM OP OINT: TOPICAL_OINTMENT | OPHTHALMIC | 0 refills | Status: DC

## 2020-11-20 MED ORDER — FLUORESCEIN SODIUM 1 MG OP STRP
1.0000 | ORAL_STRIP | Freq: Once | OPHTHALMIC | Status: AC
  Administered 2020-11-20: 1 via OPHTHALMIC

## 2020-11-20 MED ORDER — FLUORESCEIN SODIUM 1 MG OP STRP
ORAL_STRIP | OPHTHALMIC | Status: AC
  Filled 2020-11-20: qty 1

## 2020-11-20 MED ORDER — TETRACAINE HCL 0.5 % OP SOLN
OPHTHALMIC | Status: DC
Start: 2020-11-20 — End: 2020-11-21
  Filled 2020-11-20: qty 4

## 2020-11-20 MED ORDER — ERYTHROMYCIN 5 MG/GM OP OINT: TOPICAL_OINTMENT | OPHTHALMIC | 0 refills | Status: AC

## 2020-11-20 MED ORDER — TETRACAINE HCL 0.5 % OP SOLN
1.0000 [drp] | Freq: Once | OPHTHALMIC | Status: AC
  Administered 2020-11-20: 1 [drp] via OPHTHALMIC
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
## 2020-11-20 NOTE — Discharge Instructions (Addendum)
You have been seen and discharged from the emergency department.  You have a right corneal foreign body.  You are to follow-up with the ophthalmologist tomorrow morning at 815 am.  The address and contact information is attached.  Use the erythromycin ointment tonight and tomorrow morning.  Take Tylenol and ibuprofen as needed for pain control if you have any worsening symptoms, vision loss or further concerns for health please return to an emergency department for further evaluation.

## 2020-11-20 NOTE — ED Triage Notes (Signed)
  Patient comes in with R eye pain that started when he was on the way home from work today.  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  Looks like a small brown dot under patients R pupil.  Pain 4/10.  No complaints of vision loss or blurriness.
